# Patient Record
Sex: Male | Born: 1970 | Race: White | Hispanic: No | Marital: Married | Smoking: Never smoker
Health system: Southern US, Community
[De-identification: ages and names within clinical notes are randomized; demographics above are authoritative.]

---

## 2018-09-30 ENCOUNTER — Telehealth: Payer: Self-pay | Admitting: Hematology

## 2018-09-30 NOTE — Telephone Encounter (Signed)
A new patient appt has been scheduled for the pt to see Dr. Irene Limbo on 4/2 at 11am. Pt has been cld and made aware to arrive 15 minutes early.

## 2018-10-01 ENCOUNTER — Telehealth: Payer: Self-pay | Admitting: *Deleted

## 2018-10-01 NOTE — Telephone Encounter (Signed)
Contacted patient per [facility] current rescheduling guidelines.  Dr.Kale would like to reschedule new patient appt and reschedule in 6-8 weeks. Patient advised if symptomatic in any way before appt to contact Dr. Grier Mitts office.  Informed patient that scheduling will contact them. Patient verbalized understanding.  Schedule msg sent.

## 2018-10-02 ENCOUNTER — Ambulatory Visit: Payer: Self-pay | Admitting: Hematology

## 2018-10-03 ENCOUNTER — Telehealth: Payer: Self-pay | Admitting: Hematology

## 2018-10-03 NOTE — Telephone Encounter (Signed)
New patient appt has been rescheduled to see Dr. Irene Limbo on 5/28 at 11am. Pt has been cld and agreed tot he appt date and time.

## 2018-10-21 ENCOUNTER — Telehealth: Payer: Self-pay | Admitting: Hematology

## 2018-10-21 ENCOUNTER — Telehealth: Payer: Self-pay | Admitting: *Deleted

## 2018-10-21 NOTE — Telephone Encounter (Signed)
Patient called to report swelling and tenderness on right side of neck. States he thinks he should come in sooner than appt 5/28. Per Dr. Irene Limbo, make appt. New patient coordinator contacted, she will contact patient.

## 2018-10-21 NOTE — Telephone Encounter (Signed)
Attempted to return the pt's phone call but his vm isn't setup.

## 2018-10-22 ENCOUNTER — Other Ambulatory Visit: Payer: Self-pay

## 2018-10-22 ENCOUNTER — Inpatient Hospital Stay: Payer: Managed Care, Other (non HMO)

## 2018-10-22 ENCOUNTER — Telehealth: Payer: Self-pay | Admitting: Hematology

## 2018-10-22 ENCOUNTER — Inpatient Hospital Stay: Payer: Managed Care, Other (non HMO) | Attending: Hematology | Admitting: Hematology

## 2018-10-22 VITALS — BP 104/69 | HR 60 | Temp 97.5°F | Resp 17 | Ht 77.11 in | Wt 237.9 lb

## 2018-10-22 DIAGNOSIS — Z8572 Personal history of non-Hodgkin lymphomas: Secondary | ICD-10-CM | POA: Diagnosis not present

## 2018-10-22 DIAGNOSIS — C8331 Diffuse large B-cell lymphoma, lymph nodes of head, face, and neck: Secondary | ICD-10-CM

## 2018-10-22 LAB — CBC WITH DIFFERENTIAL/PLATELET
Abs Immature Granulocytes: 0.01 10*3/uL (ref 0.00–0.07)
Basophils Absolute: 0 10*3/uL (ref 0.0–0.1)
Basophils Relative: 1 %
Eosinophils Absolute: 0.1 10*3/uL (ref 0.0–0.5)
Eosinophils Relative: 2 %
HCT: 48.4 % (ref 39.0–52.0)
Hemoglobin: 15.8 g/dL (ref 13.0–17.0)
Immature Granulocytes: 0 %
Lymphocytes Relative: 21 %
Lymphs Abs: 1 10*3/uL (ref 0.7–4.0)
MCH: 28.7 pg (ref 26.0–34.0)
MCHC: 32.6 g/dL (ref 30.0–36.0)
MCV: 88 fL (ref 80.0–100.0)
Monocytes Absolute: 0.4 10*3/uL (ref 0.1–1.0)
Monocytes Relative: 9 %
Neutro Abs: 3 10*3/uL (ref 1.7–7.7)
Neutrophils Relative %: 67 %
Platelets: 184 10*3/uL (ref 150–400)
RBC: 5.5 MIL/uL (ref 4.22–5.81)
RDW: 12.7 % (ref 11.5–15.5)
WBC: 4.5 10*3/uL (ref 4.0–10.5)
nRBC: 0 % (ref 0.0–0.2)

## 2018-10-22 LAB — CMP (CANCER CENTER ONLY)
ALT: 15 U/L (ref 0–44)
AST: 19 U/L (ref 15–41)
Albumin: 4.2 g/dL (ref 3.5–5.0)
Alkaline Phosphatase: 56 U/L (ref 38–126)
Anion gap: 10 (ref 5–15)
BUN: 14 mg/dL (ref 6–20)
CO2: 27 mmol/L (ref 22–32)
Calcium: 9.4 mg/dL (ref 8.9–10.3)
Chloride: 106 mmol/L (ref 98–111)
Creatinine: 1.07 mg/dL (ref 0.61–1.24)
GFR, Est AFR Am: >60 mL/min (ref >60)
GFR, Estimated: >60 mL/min (ref >60)
Glucose, Bld: 87 mg/dL (ref 70–99)
Potassium: 4.2 mmol/L (ref 3.5–5.1)
Sodium: 143 mmol/L (ref 135–145)
Total Bilirubin: 0.6 mg/dL (ref 0.3–1.2)
Total Protein: 7.4 g/dL (ref 6.5–8.1)

## 2018-10-22 LAB — LACTATE DEHYDROGENASE: LDH: 157 U/L (ref 98–192)

## 2018-10-22 LAB — TSH: TSH: 3.361 u[IU]/mL (ref 0.320–4.118)

## 2018-10-22 LAB — T4, FREE: Free T4: 0.76 ng/dL — ABNORMAL LOW (ref 0.82–1.77)

## 2018-10-22 NOTE — Progress Notes (Signed)
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 He was staged as DLBCL, NOS, stage IA, with no BCL2, no BCL6, and no MYC6 rearrangement events detected on FISH. He completed 3 planned cycles of R-CHOP between 02/03/18 and 03/20/18 with Dr. Lavona Mound in [place], [place]. The pt then received consolidative ISRT of 48 Gy over 24 fractions to the right parotid and right neck between 04/28/18 and 05/23/18 with Dr. Zara Council in [place], [place]. His last imaging from 07/25/18 CT Neck did not reveal any remaining signs of lymphoma or suspicious adenopathy.  HISTORY OF PRESENTING ILLNESS:   Terry Dillon is a wonderful 48 y.o. male who has been referred to [place] by Dr. Lavona Mound  for evaluation and management of his Diffuse Large B-Cell Lymphoma. The pt reports that he is doing well overall.   The pt reports that he first noticed a small right neck swelling in May 2019. He notes that he did not feel that this changed much prior to this biopsy in July 2019. He did not have any fevers, chills, night sweats nor unexpected weight loss. The pt notes that he was found to have an infection in his parotid gland, which responded to a week of antibiotics, just prior to his initial biopsy.  The pt notes that he began feeling some soreness in the right side of his neck two weeks ago, which he attributed to working out. He notes that his wife felt that this spot is swollen two days ago, though the pt denies feeling nodularity or swelling. He is not sure if this is scar tissue related to his RT or recurrence.  He denies local trauma, sore throat, cough, runny nose or concern for  infections. He denies fevers, chills, night sweats or unexpected weight loss.   The pt notes that he is sleeping alright and endorses good energy levels. He notes that he is pretty thirsty, and his dry mouth is improving slowly since completing RT. He endorses some pain in his right jaw since completing RT when he yawns or opens his mouth widely. He notes that he does not have other dental pain.  The pt notes that he still has his port, and that this hasn't been flushed or accessed since he completed chemotherapy in October.  Most recent lab results not made available at time of initial visit.  On review of systems, pt reports right neck soreness, good energy levels, staying active, right jaw pain, and denies fevers, chills, night sweats, unexpected weigh loss, dental pain, cough, runny nose, throat soreness, back pains, abdominal pains, inguinal pain, leg swelling, and any other symptoms.  On PMHx the pt reports DLBCL diagnosed in July 2019 and denies other medical concerns. 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 The pt denies consuming ETOH whatsoever.  MEDICAL HISTORY:  No past medical history on file.  SURGICAL HISTORY: Cervical LN biopsy  SOCIAL HISTORY: Social History   Socioeconomic History  . Marital status: Married    Spouse name: Not on file  . Number of children: Not on file  . Years of education: Not on file  . Highest education level: Not on  file  Occupational History  . Not on file  Social Needs  . Financial resource strain: Not on file  . Food insecurity:    Worry: Not on file    Inability: Not on file  . Transportation needs:    Medical: Not on file    Non-medical: Not on file  Tobacco Use  . Smoking status: Not on file  Substance and Sexual Activity  . Alcohol use: Not on file  . Drug use: Not on file  . Sexual activity: Not on file  Lifestyle  . Physical activity:    Days per week: Not on file    Minutes per  session: Not on file  . Stress: Not on file  Relationships  . Social connections:    Talks on phone: Not on file    Gets together: Not on file    Attends religious service: Not on file    Active member of club or organization: Not on file    Attends meetings of clubs or organizations: Not on file    Relationship status: Not on file  . Intimate partner violence:    Fear of current or ex partner: Not on file    Emotionally abused: Not on file    Physically abused: Not on file    Forced sexual activity: Not on file  Other Topics Concern  . Not on file  Social History Narrative  . Not on file    FAMILY HISTORY: No family history on file.  ALLERGIES:  has no allergies on file.  MEDICATIONS:  No current outpatient medications on file.   No current facility-administered medications for this visit.     REVIEW OF SYSTEMS:    10 Point review of Systems was done is negative except as noted above.  PHYSICAL EXAMINATION: ECOG PERFORMANCE STATUS: 0 - Asymptomatic  . Vitals:   10/22/18 1038  BP: 104/69  Pulse: 60  Resp: 17  Temp: (!) 97.5 F (36.4 C)  SpO2: 99%   Filed Weights   10/22/18 1038  Weight: 237 lb 14.4 oz (107.9 kg)   .Body mass index is 28.13 kg/m.  GENERAL:alert, in no acute distress and comfortable SKIN: no acute rashes, no significant lesions EYES: conjunctiva are pink and non-injected, sclera anicteric OROPHARYNX: MMM, no exudates, no oropharyngeal erythema or ulceration NECK: supple, no JVD LYMPH:  no palpable lymphadenopathy in the cervical, axillary or inguinal regions LUNGS: clear to auscultation b/l with normal respiratory effort HEART: regular rate & rhythm ABDOMEN:  normoactive bowel sounds , non tender, not distended. Extremity: no pedal edema PSYCH: alert & oriented x 3 with fluent speech NEURO: no focal motor/sensory deficits  LABORATORY DATA:  I have reviewed the data as listed  . CBC Latest Ref Rng & Units 10/22/2018  WBC 4.0 -  10.5 K/uL 4.5  Hemoglobin 13.0 - 17.0 g/dL 15.8  Hematocrit 39.0 - 52.0 % 48.4  Platelets 150 - 400 K/uL 184    . CMP Latest Ref Rng & Units 10/22/2018  Glucose 70 - 99 mg/dL 87  BUN 6 - 20 mg/dL 14  Creatinine 0.61 - 1.24 mg/dL 1.07  Sodium 135 - 145 mmol/L 143  Potassium 3.5 - 5.1 mmol/L 4.2  Chloride 98 - 111 mmol/L 106  CO2 22 - 32 mmol/L 27  Calcium 8.9 - 10.3 mg/dL 9.4  Total Protein 6.5 - 8.1 g/dL 7.4  Total Bilirubin 0.3 - 1.2 mg/dL 0.6  Alkaline Phos 38 - 126 U/L 56  AST 15 - 41  U/L 19  ALT 0 - 44 U/L 15   . Lab Results  Component Value Date   LDH 157 10/22/2018     RADIOGRAPHIC STUDIES: I have personally reviewed the radiological images as listed and agreed with the findings in the report.  07/25/18 CT Neck, s/p ISRT and R-CHOP x3 cycles:   04/18/18 Restaging PET/CT s/p R-CHOP x3 cycles (prior to ISRT):  01/17/18 Initial Staging PET/CT:    No results found.   ASSESSMENT & PLAN:  48 y.o. male with  1. History of Diffuse Large B-Cell Lymphoma, NOS, Stage IA 01/03/18 Excisional biopsy of Right Level II Cervical LN revealed DLBCL, germinal center type FISH negative for BCL2, BCL6, and MYC6 rearrangements S/p 3 cycles of R-CHOP between 02/03/18 and 03/20/18 S/p Consolidative ISRT of 48 Gy over 24 fractions to right parotied and right neck between 04/28/18 and 05/23/18 07/25/18 CT Neck did not reveal any remaining signs of lymphoma nor suspicious adenopathy  PLAN: -Discussed that I did not palpate a discrete enlarged lymph node in the right cervical region specifically, nor anywhere else in general. -Feel that right cervical soreness is related to scar tissues vs muscular strain -Discussed that if the pt develops fevers, drainage, worsening swelling or increasing pain in the jaw, this would be reason to obtain a CT to rule out radiation related bone changes. -The pt shows no clinical or lab progression/return of his DLBCL at this time. Cbc, cmpand LDH WNL today  -No indication for further treatment at this time. -Will refer the pt to IR for port removal -Will order labs today -Will see the pt back in 3 months  2. Low FT4 normal TSH -monitor - will rpt on followup.  -Labs today -Interventional radiology for port removal in 1-2 weeks -RTC with Dr Irene Limbo with labs in 3 months   All of the patients questions were answered with apparent satisfaction. The patient knows to call the clinic with any problems, questions or concerns.  The total time spent in the appt was 45 minutes and more than 50% was on counseling and direct patient cares.    Sullivan Lone MD MS AAHIVMS [facility] Hematology/Oncology Physician [facility]  (Office):       [phone] (Work cell):  [phone] (Fax):           [phone]  10/22/2018 11:29 AM  I, Baldwin Jamaica, am acting as a scribe for Dr. Sullivan Lone.   .I have reviewed the above documentation for accuracy and completeness, and I agree with the above. Brunetta Genera MD

## 2018-10-22 NOTE — Telephone Encounter (Signed)
Scheduled appt per 4/22 los. °

## 2018-11-27 ENCOUNTER — Ambulatory Visit: Payer: Self-pay | Admitting: Hematology

## 2018-12-17 ENCOUNTER — Other Ambulatory Visit: Payer: Self-pay | Admitting: Radiology

## 2018-12-18 ENCOUNTER — Other Ambulatory Visit: Payer: Self-pay | Admitting: Radiology

## 2018-12-19 ENCOUNTER — Ambulatory Visit (HOSPITAL_COMMUNITY)
Admission: RE | Admit: 2018-12-19 | Discharge: 2018-12-19 | Disposition: A | Discharge status: Home / Self Care | Payer: Managed Care, Other (non HMO) | Source: Ambulatory Visit | Attending: Hematology | Admitting: Hematology

## 2018-12-19 ENCOUNTER — Encounter (HOSPITAL_COMMUNITY): Payer: Self-pay

## 2018-12-19 ENCOUNTER — Other Ambulatory Visit: Payer: Self-pay

## 2018-12-19 DIAGNOSIS — Z452 Encounter for adjustment and management of vascular access device: Secondary | ICD-10-CM | POA: Diagnosis present

## 2018-12-19 DIAGNOSIS — Z923 Personal history of irradiation: Secondary | ICD-10-CM | POA: Insufficient documentation

## 2018-12-19 DIAGNOSIS — Z9221 Personal history of antineoplastic chemotherapy: Secondary | ICD-10-CM | POA: Diagnosis not present

## 2018-12-19 DIAGNOSIS — Z8572 Personal history of non-Hodgkin lymphomas: Secondary | ICD-10-CM | POA: Insufficient documentation

## 2018-12-19 DIAGNOSIS — C8331 Diffuse large B-cell lymphoma, lymph nodes of head, face, and neck: Secondary | ICD-10-CM

## 2018-12-19 HISTORY — PX: IR REMOVAL TUN ACCESS W/ PORT W/O FL MOD SED: IMG2290

## 2018-12-19 LAB — CBC WITH DIFFERENTIAL/PLATELET
Abs Immature Granulocytes: 0.01 10*3/uL (ref 0.00–0.07)
Basophils Absolute: 0 10*3/uL (ref 0.0–0.1)
Basophils Relative: 0 %
Eosinophils Absolute: 0.1 10*3/uL (ref 0.0–0.5)
Eosinophils Relative: 2 %
HCT: 43.3 % (ref 39.0–52.0)
Hemoglobin: 14.6 g/dL (ref 13.0–17.0)
Immature Granulocytes: 0 %
Lymphocytes Relative: 18 %
Lymphs Abs: 0.8 10*3/uL (ref 0.7–4.0)
MCH: 29.9 pg (ref 26.0–34.0)
MCHC: 33.7 g/dL (ref 30.0–36.0)
MCV: 88.5 fL (ref 80.0–100.0)
Monocytes Absolute: 0.5 10*3/uL (ref 0.1–1.0)
Monocytes Relative: 11 %
Neutro Abs: 3.1 10*3/uL (ref 1.7–7.7)
Neutrophils Relative %: 69 %
Platelets: 178 10*3/uL (ref 150–400)
RBC: 4.89 MIL/uL (ref 4.22–5.81)
RDW: 12.1 % (ref 11.5–15.5)
WBC: 4.4 10*3/uL (ref 4.0–10.5)
nRBC: 0 % (ref 0.0–0.2)

## 2018-12-19 LAB — PROTIME-INR
INR: 0.9 (ref 0.8–1.2)
Prothrombin Time: 12.1 seconds (ref 11.4–15.2)

## 2018-12-19 MED ORDER — FENTANYL CITRATE (PF) 100 MCG/2ML IJ SOLN
INTRAMUSCULAR | Status: AC | PRN
  Administered 2018-12-19 (×2): 50 ug via INTRAVENOUS

## 2018-12-19 MED ORDER — SODIUM CHLORIDE 0.9 % IV SOLN
INTRAVENOUS | Status: DC
  Administered 2018-12-19: 11:00:00 via INTRAVENOUS

## 2018-12-19 MED ORDER — FENTANYL CITRATE (PF) 100 MCG/2ML IJ SOLN
INTRAMUSCULAR | Status: AC
  Filled 2018-12-19: qty 2

## 2018-12-19 MED ORDER — MIDAZOLAM HCL 2 MG/2ML IJ SOLN
INTRAMUSCULAR | Status: AC | PRN
  Administered 2018-12-19 (×4): 1 mg via INTRAVENOUS

## 2018-12-19 MED ORDER — LIDOCAINE HCL (PF) 1 % IJ SOLN
INTRAMUSCULAR | Status: AC | PRN
  Administered 2018-12-19: 10 mL

## 2018-12-19 MED ORDER — CEFAZOLIN SODIUM-DEXTROSE 2-4 GM/100ML-% IV SOLN
INTRAVENOUS | Status: AC
  Administered 2018-12-19: 2 g via INTRAVENOUS
  Filled 2018-12-19: qty 100

## 2018-12-19 MED ORDER — LIDOCAINE HCL 1 % IJ SOLN
INTRAMUSCULAR | Status: AC
  Filled 2018-12-19: qty 20

## 2018-12-19 MED ORDER — CEFAZOLIN SODIUM-DEXTROSE 2-4 GM/100ML-% IV SOLN
2.0000 g | INTRAVENOUS | Status: AC
  Administered 2018-12-19: 2 g via INTRAVENOUS

## 2018-12-19 MED ORDER — MIDAZOLAM HCL 2 MG/2ML IJ SOLN
INTRAMUSCULAR | Status: AC
  Filled 2018-12-19: qty 4

## 2018-12-19 NOTE — Consult Note (Signed)
Chief Complaint: Patient was seen in consultation today for port a cath removal  Referring Physician(s): Brunetta Genera  Supervising Physician: Aletta Edouard  Patient Status: [facility] - Out-pt  History of Present Illness: Terry Dillon is a 48 y.o. male with history of diffuse large B-cell lymphoma, status post chemoradiation and currently in remission.  He had left chest wall Port-A-Cath placed in [place] in 2019.  He presents today for Port-A-Cath removal.   PMH: Denies hypertension, coronary artery disease, lung disease, diabetes PSH: Cervical lymph node biopsy  Allergies: Patient has no allergy information on record.  Medications: Prior to Admission medications   Not on File     No family history on file.  Social History   Socioeconomic History  . Marital status: Married    Spouse name: Not on file  . Number of children: Not on file  . Years of education: Not on file  . Highest education level: Not on file  Occupational History  . Not on file  Social Needs  . Financial resource strain: Not on file  . Food insecurity    Worry: Not on file    Inability: Not on file  . Transportation needs    Medical: Not on file    Non-medical: Not on file  Tobacco Use  . Smoking status: Not on file  Substance and Sexual Activity  . Alcohol use: Not on file  . Drug use: Not on file  . Sexual activity: Not on file  Lifestyle  . Physical activity    Days per week: Not on file    Minutes per session: Not on file  . Stress: Not on file  Relationships  . Social Herbalist on phone: Not on file    Gets together: Not on file    Attends religious service: Not on file    Active member of club or organization: Not on file    Attends meetings of clubs or organizations: Not on file    Relationship status: Not on file  Other Topics Concern  . Not on file  Social History Narrative  . Not on file      Review of Systems denies fever, headache, chest pain,  dyspnea, cough, abdominal/back pain, nausea, vomiting or bleeding.  Vital Signs:pend   Physical Exam awake, alert.  Chest clear to auscultation bilaterally.  Clean, intact left chest wall Port-A-Cath.  Heart with regular rate and rhythm. Abd soft, positive bowel sounds, nontender.  No lower extremity edema.  Imaging: No results found.  Labs:  CBC: Recent Labs    10/22/18 1129  WBC 4.5  HGB 15.8  HCT 48.4  PLT 184    COAGS: No results for input(s): INR, APTT in the last 8760 hours.  BMP: Recent Labs    10/22/18 1129  NA 143  K 4.2  CL 106  CO2 27  GLUCOSE 87  BUN 14  CALCIUM 9.4  CREATININE 1.07  GFRNONAA >60  GFRAA >60    LIVER FUNCTION TESTS: Recent Labs    10/22/18 1129  BILITOT 0.6  AST 19  ALT 15  ALKPHOS 56  PROT 7.4  ALBUMIN 4.2    TUMOR MARKERS: No results for input(s): AFPTM, CEA, CA199, CHROMGRNA in the last 8760 hours.  Assessment and Plan: Patient with history of diffuse large B-cell lymphoma 2019, status post chemoradiation and currently in remission.  He had left chest wall Port-A-Cath placed in [place] in 2019.  He presents today for Port-A-Cath removal.  Details/risks of procedure, including but not limited to, internal bleeding, infection, injury to adjacent structures discussed with patient with his understanding and consent.  LABS PENDING   Thank you for this interesting consult.  I greatly enjoyed meeting Terry Dillon and look forward to participating in their care.  A copy of this report was sent to the requesting provider on this date.  Electronically Signed: D. Rowe Robert, PA-C 12/19/2018, 10:18 AM   I spent a total of 20 minutes  in face to face in clinical consultation, greater than 50% of which was counseling/coordinating care for Port-A-Cath removal

## 2018-12-19 NOTE — Procedures (Signed)
Interventional Radiology Procedure Note  Procedure: Port removal  Complications: None  Estimated Blood Loss: < 10 mL  Findings: Left chest port removed in entirety. Wound closed.  Mosella Kasa T. Charlese Gruetzmacher, M.D Pager:  [phone]   

## 2018-12-19 NOTE — Discharge Instructions (Signed)
Moderate Conscious Sedation, Adult, Care After These instructions provide you with information about caring for yourself after your procedure. Your health care provider may also give you more specific instructions. Your treatment has been planned according to current medical practices, but problems sometimes occur. Call your health care provider if you have any problems or questions after your procedure. What can I expect after the procedure? After your procedure, it is common:  To feel sleepy for several hours.  To feel clumsy and have poor balance for several hours.  To have poor judgment for several hours.  To vomit if you eat too soon. Follow these instructions at home: For at least 24 hours after the procedure:   Do not: ? Participate in activities where you could fall or become injured. ? Drive. ? Use heavy machinery. ? Drink alcohol. ? Take sleeping pills or medicines that cause drowsiness. ? Make important decisions or sign legal documents. ? Take care of children on your own.  Rest. Eating and drinking  Follow the diet recommended by your health care provider.  If you vomit: ? Drink water, juice, or soup when you can drink without vomiting. ? Make sure you have little or no nausea before eating solid foods. General instructions  Have a responsible adult stay with you until you are awake and alert.  Take over-the-counter and prescription medicines only as told by your health care provider.  If you smoke, do not smoke without supervision.  Keep all follow-up visits as told by your health care provider. This is important. Contact a health care provider if:  You keep feeling nauseous or you keep vomiting.  You feel light-headed.  You develop a rash.  You have a fever. Get help right away if:  You have trouble breathing. This information is not intended to replace advice given to you by your health care provider. Make sure you discuss any questions you have  with your health care provider. Document Released: 04/08/2013 Document Revised: 11/21/2015 Document Reviewed: 10/08/2015 Elsevier Interactive Patient Education  2019 [address] Removal, Care After This sheet gives you information about how to care for yourself after your procedure. Your health care provider may also give you more specific instructions. If you have problems or questions, contact your health care provider. What can I expect after the procedure? After the procedure, it is common to have:  Soreness or pain near your incision.  Some swelling or bruising near your incision. Follow these instructions at home: Medicines  Take over-the-counter and prescription medicines only as told by your health care provider.  If you were prescribed an antibiotic medicine, take it as told by your health care provider. Do not stop taking the antibiotic even if you start to feel better. Bathing  Do not take baths, swim, or use a hot tub until your health care provider approves. Ask your health care provider if you can take showers. You may only be allowed to take sponge baths.  You may shower tomorrow. Incision care   Follow instructions from your health care provider about how to take care of your incision. Make sure you: ? Wash your hands with soap and water before you change your bandage (dressing). If soap and water are not available, use hand sanitizer. ? Change your dressing as told by your health care provider.  You may remove your dressing tomorrow. ? Keep your dressing dry. ? Leave stitches (sutures), skin glue, or adhesive strips in place. These skin closures  may need to stay in place for 2 weeks or longer. If adhesive strip edges start to loosen and curl up, you may trim the loose edges. Do not remove adhesive strips completely unless your health care provider tells you to do that.  Check your incision area every day for signs of infection. Check for: ? More  redness, swelling, or pain. ? More fluid or blood. ? Warmth. ? Pus or a bad smell. Driving   Do not drive for 24 hours if you were given a medicine to help you relax (sedative) during your procedure.  If you did not receive a sedative, ask your health care provider when it is safe to drive. Activity  Return to your normal activities as told by your health care provider. Ask your health care provider what activities are safe for you.  Do not lift anything that is heavier than 10 lb (4.5 kg), or the limit that you are told, until your health care provider says that it is safe.  Do not do activities that involve lifting your arms over your head. General instructions  Do not use any products that contain nicotine or tobacco, such as cigarettes and e-cigarettes. These can delay healing. If you need help quitting, ask your health care provider.  Keep all follow-up visits as told by your health care provider. This is important. Contact a health care provider if:  You have more redness, swelling, or pain around your incision.  You have more fluid or blood coming from your incision.  Your incision feels warm to the touch.  You have pus or a bad smell coming from your incision.  You have pain that is not relieved by your pain medicine. Get help right away if you have:  A fever or chills.  Chest pain.  Difficulty breathing. Summary  After the procedure, it is common to have pain, soreness, swelling, or bruising near your incision.  If you were prescribed an antibiotic medicine, take it as told by your health care provider. Do not stop taking the antibiotic even if you start to feel better.  Do not drive for 24 hours if you were given a sedative during your procedure.  Return to your normal activities as told by your health care provider. Ask your health care provider what activities are safe for you. This information is not intended to replace advice given to you by your health  care provider. Make sure you discuss any questions you have with your health care provider. Document Released: 05/30/2015 Document Revised: 08/01/2017 Document Reviewed: 08/01/2017 Elsevier Interactive Patient Education  2019 [employer].

## 2019-01-19 ENCOUNTER — Telehealth: Payer: Self-pay | Admitting: *Deleted

## 2019-01-19 NOTE — Telephone Encounter (Signed)
Patient has appt on 7/22 for 3 month f/u. Patient states he forgot he had the appt until he received the screening call. He would like to reschedule appts for lab/MD as he is out of town this week. Appts on 7/22 cancelled and message sent to scheduling.

## 2019-01-21 ENCOUNTER — Inpatient Hospital Stay: Payer: Managed Care, Other (non HMO)

## 2019-01-21 ENCOUNTER — Inpatient Hospital Stay: Payer: Managed Care, Other (non HMO) | Admitting: Hematology

## 2019-02-09 NOTE — Progress Notes (Signed)
HEMATOLOGY/ONCOLOGY CONSULTATION NOTE  Date of Service: 02/09/2019  Patient Care Team: System, Pcp Not In as PCP - General  CHIEF COMPLAINTS/PURPOSE OF CONSULTATION:  Diffuse Large B-Cell Lymphoma  Oncologic History:  Terry Dillon was diagnosed with Diffuse Large B-Cell Lymphoma, germinal center type, with an excisional biopsy of a right level II deep cervical lymph node on 01/03/18. He was staged as DLBCL, NOS, stage IA, with no BCL2, no BCL6, and no MYC6 rearrangement events detected on FISH. He completed 3 planned cycles of R-CHOP between 02/03/18 and 03/20/18 with Dr. Lavona Mound in [place], [place]. The pt then received consolidative ISRT of 48 Gy over 24 fractions to the right parotid and right neck between 04/28/18 and 05/23/18 with Dr. Zara Council in [place], [place]. His last imaging from 07/25/18 CT Neck did not reveal any remaining signs of lymphoma or suspicious adenopathy.  HISTORY OF PRESENTING ILLNESS:   Terry Dillon is a wonderful 48 y.o. male who has been referred to [place] by Dr. Lavona Mound  for evaluation and management of his Diffuse Large B-Cell Lymphoma. The pt reports that he is doing well overall.   The pt reports that he first noticed a small right neck swelling in May 2019. He notes that he did not feel that this changed much prior to this biopsy in July 2019. He did not have any fevers, chills, night sweats nor unexpected weight loss. The pt notes that he was found to have an infection in his parotid gland, which responded to a week of antibiotics, just prior to his initial biopsy.  The pt notes that he began feeling some soreness in the right side of his neck two weeks ago, which he attributed to working out. He notes that his wife felt that this spot is swollen two days ago, though the pt denies feeling nodularity or swelling. He is not sure if this is scar tissue related to his RT or recurrence.  He denies local trauma, sore throat, cough, runny nose or concern for  infections. He denies fevers, chills, night sweats or unexpected weight loss.   The pt notes that he is sleeping alright and endorses good energy levels. He notes that he is pretty thirsty, and his dry mouth is improving slowly since completing RT. He endorses some pain in his right jaw since completing RT when he yawns or opens his mouth widely. He notes that he does not have other dental pain.  The pt notes that he still has his port, and that this hasn't been flushed or accessed since he completed chemotherapy in October.  Most recent lab results not made available at time of initial visit.  On review of systems, pt reports right neck soreness, good energy levels, staying active, right jaw pain, and denies fevers, chills, night sweats, unexpected weigh loss, dental pain, cough, runny nose, throat soreness, back pains, abdominal pains, inguinal pain, leg swelling, and any other symptoms.  On PMHx the pt reports DLBCL diagnosed in July 2019 and denies other medical concerns. 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 The pt denies consuming ETOH whatsoever.  Interval History  Terry Dillon is a 48 y.o. male presenting today for the management and evaluation of DLBCL. The patient's last visit with [place] was on 10/22/2018. The pt reports that she is doing well overall.  The pt reports no new concerns. He reports a dizziness and "swooshy sound" in his head, which began during chemotherapy. The episodes  come on randomly and cause an aversion to light. It happened much less often after stopping chemotherapy, but it has happened twice in the past week. Denies hx of hearing problems, ear injuries, migraine headaches.  His port was removed on 12/19/2018. He has some neck soreness, but it is not bad. Denies belly pain, unexpected weight changes, SOB, fevers, chills, night sweats. He is moving his bowels well.  Lab results today (02/10/2019) of CBC w/diff is as follows: all  values are WNL except for Creatinine at 1.40, GFR at 59 02/10/2019 free T4 is pending 02/10/2019 TSH is 3.218, FT4 0.81 02/10/2019 LDH is 215  On review of systems, pt reports occasional dizziness, moving his bowels well, and denies belly pain, unexpected weight changes, SOB, fevers, chills, belly pain, night sweats and any other symptoms.   MEDICAL HISTORY:  No past medical history on file.  SURGICAL HISTORY: Cervical LN biopsy  SOCIAL HISTORY: Social History   Socioeconomic History  . Marital status: Married    Spouse name: Not on file  . Number of children: Not on file  . Years of education: Not on file  . Highest education level: Not on file  Occupational History  . Not on file  Social Needs  . Financial resource strain: Not on file  . Food insecurity    Worry: Not on file    Inability: Not on file  . Transportation needs    Medical: Not on file    Non-medical: Not on file  Tobacco Use  . Smoking status: Never Smoker  . Smokeless tobacco: Never Used  Substance and Sexual Activity  . Alcohol use: Not on file  . Drug use: Not on file  . Sexual activity: Not on file  Lifestyle  . Physical activity    Days per week: Not on file    Minutes per session: Not on file  . Stress: Not on file  Relationships  . Social Herbalist on phone: Not on file    Gets together: Not on file    Attends religious service: Not on file    Active member of club or organization: Not on file    Attends meetings of clubs or organizations: Not on file    Relationship status: Not on file  . Intimate partner violence    Fear of current or ex partner: Not on file    Emotionally abused: Not on file    Physically abused: Not on file    Forced sexual activity: Not on file  Other Topics Concern  . Not on file  Social History Narrative  . Not on file    FAMILY HISTORY: No family history on file.  ALLERGIES:  has no allergies on file.  MEDICATIONS:  No current outpatient  medications on file.   No current facility-administered medications for this visit.     REVIEW OF SYSTEMS:    A 10+ POINT REVIEW OF SYSTEMS WAS OBTAINED including neurology, dermatology, psychiatry, cardiac, respiratory, lymph, extremities, GI, GU, Musculoskeletal, constitutional, breasts, reproductive, HEENT.  All pertinent positives are noted in the HPI.  All others are negative. Marland Kitchen  PHYSICAL EXAMINATION: ECOG PERFORMANCE STATUS: 0 - Asymptomatic  . Vitals:   02/10/19 1455  BP: 114/64  Pulse: 68  Resp: 18  Temp: 98.3 F (36.8 C)  SpO2: 99%   Filed Weights   02/10/19 1455  Weight: 239 lb 3.2 oz (108.5 kg)   .Body mass index is 28.28 kg/m.   GENERAL:alert, in no  acute distress and comfortable SKIN: no acute rashes, no significant lesions EYES: conjunctiva are pink and non-injected, sclera anicteric OROPHARYNX: MMM, no exudates, no oropharyngeal erythema or ulceration NECK: supple, no JVD LYMPH:  no palpable lymphadenopathy in the cervical, axillary or inguinal regions LUNGS: clear to auscultation b/l with normal respiratory effort HEART: regular rate & rhythm ABDOMEN:  normoactive bowel sounds , non tender, not distended. No palpable hepatosplenomegaly.  Extremity: no pedal edema PSYCH: alert & oriented x 3 with fluent speech NEURO: no focal motor/sensory deficits   LABORATORY DATA:  I have reviewed the data as listed  . CBC Latest Ref Rng & Units 02/10/2019 12/19/2018 10/22/2018  WBC 4.0 - 10.5 K/uL 5.6 4.4 4.5  Hemoglobin 13.0 - 17.0 g/dL 15.6 14.6 15.8  Hematocrit 39.0 - 52.0 % 46.0 43.3 48.4  Platelets 150 - 400 K/uL 205 178 184    . CMP Latest Ref Rng & Units 02/10/2019 10/22/2018  Glucose 70 - 99 mg/dL 90 87  BUN 6 - 20 mg/dL 15 14  Creatinine 0.61 - 1.24 mg/dL 1.40(H) 1.07  Sodium 135 - 145 mmol/L 140 143  Potassium 3.5 - 5.1 mmol/L 4.4 4.2  Chloride 98 - 111 mmol/L 103 106  CO2 22 - 32 mmol/L 23 27  Calcium 8.9 - 10.3 mg/dL 9.8 9.4  Total Protein 6.5 -  8.1 g/dL 7.3 7.4  Total Bilirubin 0.3 - 1.2 mg/dL 1.1 0.6  Alkaline Phos 38 - 126 U/L 49 56  AST 15 - 41 U/L 25 19  ALT 0 - 44 U/L 20 15   . Lab Results  Component Value Date   LDH 215 (H) 02/10/2019     RADIOGRAPHIC STUDIES: I have personally reviewed the radiological images as listed and agreed with the findings in the report.  07/25/18 CT Neck, s/p ISRT and R-CHOP x3 cycles:   04/18/18 Restaging PET/CT s/p R-CHOP x3 cycles (prior to ISRT):  01/17/18 Initial Staging PET/CT:    No results found.   ASSESSMENT & PLAN:   48 y.o. male with  1. History of Diffuse Large B-Cell Lymphoma, NOS, Stage IA 01/03/18 Excisional biopsy of Right Level II Cervical LN revealed DLBCL, germinal center type FISH negative for BCL2, BCL6, and MYC6 rearrangements S/p 3 cycles of R-CHOP between 02/03/18 and 03/20/18 S/p Consolidative ISRT of 48 Gy over 24 fractions to right parotied and right neck between 04/28/18 and 05/23/18 07/25/18 CT Neck did not reveal any remaining signs of lymphoma nor suspicious adenopathy  PLAN: -Discussed pt labwork today, 02/10/2019; blood counts are normal, Creatinine at 1.40 -LDH borderline elevated at 215 -Previously discussed that if the pt develops fevers, drainage, worsening swelling or increasing pain in the jaw, this would be reason to obtain a CT to rule out radiation related bone changes. -The pt shows no clinical or lab progression/return of his DLBCL at this time. -No indication for further treatment at this time. -The pt will let us know if he wants a referral to an ENT for his episodes of dizziness -Recommended that the pt drink at least 48-64 oz of water each day -Will see the pt back in 4 months with labs   RTC with Dr Irene Limbo with labs in 4 months   All of the patients questions were answered with apparent satisfaction. The patient knows to call the clinic with any problems, questions or concerns.  The total time spent in the appt was 35 minutes and  more than 50% was on counseling and direct patient cares.  Sullivan Lone MD MS AAHIVMS [facility] Hematology/Oncology Physician [facility]  (Office):       [phone] (Work cell):  [phone] (Fax):           [phone]  02/09/2019 9:19 PM  I, De Burrs, am acting as a scribe for Dr. Irene Limbo  .I have reviewed the above documentation for accuracy and completeness, and I agree with the above. Brunetta Genera MD

## 2019-02-10 ENCOUNTER — Other Ambulatory Visit: Payer: Self-pay

## 2019-02-10 ENCOUNTER — Inpatient Hospital Stay (HOSPITAL_BASED_OUTPATIENT_CLINIC_OR_DEPARTMENT_OTHER): Payer: Managed Care, Other (non HMO) | Admitting: Hematology

## 2019-02-10 ENCOUNTER — Inpatient Hospital Stay: Payer: Managed Care, Other (non HMO) | Attending: Hematology

## 2019-02-10 VITALS — BP 114/64 | HR 68 | Temp 98.3°F | Resp 18 | Ht 77.11 in | Wt 239.2 lb

## 2019-02-10 DIAGNOSIS — Z8572 Personal history of non-Hodgkin lymphomas: Secondary | ICD-10-CM | POA: Diagnosis not present

## 2019-02-10 DIAGNOSIS — C8331 Diffuse large B-cell lymphoma, lymph nodes of head, face, and neck: Secondary | ICD-10-CM | POA: Diagnosis not present

## 2019-02-10 DIAGNOSIS — R42 Dizziness and giddiness: Secondary | ICD-10-CM | POA: Diagnosis not present

## 2019-02-10 DIAGNOSIS — Z79899 Other long term (current) drug therapy: Secondary | ICD-10-CM | POA: Insufficient documentation

## 2019-02-10 DIAGNOSIS — R740 Nonspecific elevation of levels of transaminase and lactic acid dehydrogenase [LDH]: Secondary | ICD-10-CM | POA: Insufficient documentation

## 2019-02-10 LAB — CBC WITH DIFFERENTIAL/PLATELET
Abs Immature Granulocytes: 0.01 10*3/uL (ref 0.00–0.07)
Basophils Absolute: 0 10*3/uL (ref 0.0–0.1)
Basophils Relative: 0 %
Eosinophils Absolute: 0.1 10*3/uL (ref 0.0–0.5)
Eosinophils Relative: 1 %
HCT: 46 % (ref 39.0–52.0)
Hemoglobin: 15.6 g/dL (ref 13.0–17.0)
Immature Granulocytes: 0 %
Lymphocytes Relative: 20 %
Lymphs Abs: 1.1 10*3/uL (ref 0.7–4.0)
MCH: 29.2 pg (ref 26.0–34.0)
MCHC: 33.9 g/dL (ref 30.0–36.0)
MCV: 86.1 fL (ref 80.0–100.0)
Monocytes Absolute: 0.4 10*3/uL (ref 0.1–1.0)
Monocytes Relative: 7 %
Neutro Abs: 4 10*3/uL (ref 1.7–7.7)
Neutrophils Relative %: 72 %
Platelets: 205 10*3/uL (ref 150–400)
RBC: 5.34 MIL/uL (ref 4.22–5.81)
RDW: 12.1 % (ref 11.5–15.5)
WBC: 5.6 10*3/uL (ref 4.0–10.5)
nRBC: 0 % (ref 0.0–0.2)

## 2019-02-10 LAB — LACTATE DEHYDROGENASE: LDH: 215 U/L — ABNORMAL HIGH (ref 98–192)

## 2019-02-10 LAB — T4, FREE: Free T4: 0.81 ng/dL (ref 0.61–1.12)

## 2019-02-10 LAB — CMP (CANCER CENTER ONLY)
ALT: 20 U/L (ref 0–44)
AST: 25 U/L (ref 15–41)
Albumin: 4.3 g/dL (ref 3.5–5.0)
Alkaline Phosphatase: 49 U/L (ref 38–126)
Anion gap: 14 (ref 5–15)
BUN: 15 mg/dL (ref 6–20)
CO2: 23 mmol/L (ref 22–32)
Calcium: 9.8 mg/dL (ref 8.9–10.3)
Chloride: 103 mmol/L (ref 98–111)
Creatinine: 1.4 mg/dL — ABNORMAL HIGH (ref 0.61–1.24)
GFR, Est AFR Am: >60 mL/min (ref >60)
GFR, Estimated: 59 mL/min — ABNORMAL LOW (ref >60)
Glucose, Bld: 90 mg/dL (ref 70–99)
Potassium: 4.4 mmol/L (ref 3.5–5.1)
Sodium: 140 mmol/L (ref 135–145)
Total Bilirubin: 1.1 mg/dL (ref 0.3–1.2)
Total Protein: 7.3 g/dL (ref 6.5–8.1)

## 2019-02-11 ENCOUNTER — Telehealth: Payer: Self-pay | Admitting: Hematology

## 2019-02-11 LAB — TSH: TSH: 3.218 u[IU]/mL (ref 0.320–4.118)

## 2019-02-11 NOTE — Telephone Encounter (Signed)
Scheduled appt per 8/11 los.  Will print and mail out calendar Aug 24.

## 2019-06-12 ENCOUNTER — Inpatient Hospital Stay: Payer: Managed Care, Other (non HMO) | Admitting: Hematology

## 2019-06-12 ENCOUNTER — Inpatient Hospital Stay: Payer: Managed Care, Other (non HMO) | Attending: Hematology

## 2019-06-12 ENCOUNTER — Other Ambulatory Visit: Payer: Self-pay

## 2019-06-12 VITALS — BP 116/84 | HR 65 | Temp 97.7°F | Resp 18 | Ht 77.11 in | Wt 244.4 lb

## 2019-06-12 DIAGNOSIS — D0511 Intraductal carcinoma in situ of right breast: Secondary | ICD-10-CM | POA: Diagnosis present

## 2019-06-12 DIAGNOSIS — C8331 Diffuse large B-cell lymphoma, lymph nodes of head, face, and neck: Secondary | ICD-10-CM

## 2019-06-12 LAB — CBC WITH DIFFERENTIAL/PLATELET
Abs Immature Granulocytes: 0.01 10*3/uL (ref 0.00–0.07)
Basophils Absolute: 0 10*3/uL (ref 0.0–0.1)
Basophils Relative: 1 %
Eosinophils Absolute: 0.1 10*3/uL (ref 0.0–0.5)
Eosinophils Relative: 2 %
HCT: 48.6 % (ref 39.0–52.0)
Hemoglobin: 16.5 g/dL (ref 13.0–17.0)
Immature Granulocytes: 0 %
Lymphocytes Relative: 25 %
Lymphs Abs: 1.2 10*3/uL (ref 0.7–4.0)
MCH: 29.6 pg (ref 26.0–34.0)
MCHC: 34 g/dL (ref 30.0–36.0)
MCV: 87.1 fL (ref 80.0–100.0)
Monocytes Absolute: 0.4 10*3/uL (ref 0.1–1.0)
Monocytes Relative: 9 %
Neutro Abs: 2.9 10*3/uL (ref 1.7–7.7)
Neutrophils Relative %: 63 %
Platelets: 221 10*3/uL (ref 150–400)
RBC: 5.58 MIL/uL (ref 4.22–5.81)
RDW: 12.3 % (ref 11.5–15.5)
WBC: 4.6 10*3/uL (ref 4.0–10.5)
nRBC: 0 % (ref 0.0–0.2)

## 2019-06-12 LAB — CMP (CANCER CENTER ONLY)
ALT: 19 U/L (ref 0–44)
AST: 21 U/L (ref 15–41)
Albumin: 4.3 g/dL (ref 3.5–5.0)
Alkaline Phosphatase: 51 U/L (ref 38–126)
Anion gap: 9 (ref 5–15)
BUN: 20 mg/dL (ref 6–20)
CO2: 27 mmol/L (ref 22–32)
Calcium: 9.1 mg/dL (ref 8.9–10.3)
Chloride: 106 mmol/L (ref 98–111)
Creatinine: 1.06 mg/dL (ref 0.61–1.24)
GFR, Est AFR Am: >60 mL/min (ref >60)
GFR, Estimated: >60 mL/min (ref >60)
Glucose, Bld: 65 mg/dL — ABNORMAL LOW (ref 70–99)
Potassium: 4.3 mmol/L (ref 3.5–5.1)
Sodium: 142 mmol/L (ref 135–145)
Total Bilirubin: 0.7 mg/dL (ref 0.3–1.2)
Total Protein: 7.4 g/dL (ref 6.5–8.1)

## 2019-06-12 LAB — LACTATE DEHYDROGENASE: LDH: 165 U/L (ref 98–192)

## 2019-06-12 NOTE — Progress Notes (Signed)
HEMATOLOGY/ONCOLOGY CONSULTATION NOTE  Date of Service: 06/12/2019  Patient Care Team: System, Pcp Not In as PCP - General  CHIEF COMPLAINTS/PURPOSE OF CONSULTATION:  F/u Diffuse Large B-Cell Lymphoma  Oncologic History:  Terry Dillon was diagnosed with Diffuse Large B-Cell Lymphoma, germinal center type, with an excisional biopsy of a right level II deep cervical lymph node on 01/03/18. He was staged as DLBCL, NOS, stage IA, with no BCL2, no BCL6, and no MYC6 rearrangement events detected on FISH. He completed 3 planned cycles of R-CHOP between 02/03/18 and 03/20/18 with Dr. Lavona Mound in [place], [place]. The pt then received consolidative ISRT of 48 Gy over 24 fractions to the right parotid and right neck between 04/28/18 and 05/23/18 with Dr. Zara Council in [place], [place]. His last imaging from 07/25/18 CT Neck did not reveal any remaining signs of lymphoma or suspicious adenopathy.  HISTORY OF PRESENTING ILLNESS:   Terry Dillon is a wonderful 48 y.o. male who has been referred to [place] by Dr. Lavona Mound  for evaluation and management of his Diffuse Large B-Cell Lymphoma. The pt reports that he is doing well overall.   The pt reports that he first noticed a small right neck swelling in May 2019. He notes that he did not feel that this changed much prior to this biopsy in July 2019. He did not have any fevers, chills, night sweats nor unexpected weight loss. The pt notes that he was found to have an infection in his parotid gland, which responded to a week of antibiotics, just prior to his initial biopsy.  The pt notes that he began feeling some soreness in the right side of his neck two weeks ago, which he attributed to working out. He notes that his wife felt that this spot is swollen two days ago, though the pt denies feeling nodularity or swelling. He is not sure if this is scar tissue related to his RT or recurrence.  He denies local trauma, sore throat, cough, runny nose or concern for  infections. He denies fevers, chills, night sweats or unexpected weight loss.   The pt notes that he is sleeping alright and endorses good energy levels. He notes that he is pretty thirsty, and his dry mouth is improving slowly since completing RT. He endorses some pain in his right jaw since completing RT when he yawns or opens his mouth widely. He notes that he does not have other dental pain.  The pt notes that he still has his port, and that this hasn't been flushed or accessed since he completed chemotherapy in October.  Most recent lab results not made available at time of initial visit.  On review of systems, pt reports right neck soreness, good energy levels, staying active, right jaw pain, and denies fevers, chills, night sweats, unexpected weigh loss, dental pain, cough, runny nose, throat soreness, back pains, abdominal pains, inguinal pain, leg swelling, and any other symptoms.  On PMHx the pt reports DLBCL diagnosed in July 2019 and denies other medical concerns. 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 The pt denies consuming ETOH whatsoever.  Interval History  Terry Dillon is a 48 y.o. male presenting today for the management and evaluation of DLBCL. The patient's last visit with [place] was on 02/10/2019. The pt reports that he is doing well overall.  The pt reports he still has swishing in ears but it has not progressed.  Lab results today (06/12/19) of CBC  w/diff and CMP is as follows: all values are WNL except for Glucose Bld at 65.  On review of systems, pt denies fevers, chills, new lumps and bumps, changes in energy, back pain, abdominal pain, leg swelling and any other symptoms.   MEDICAL HISTORY:  No past medical history on file.  SURGICAL HISTORY: Cervical LN biopsy  SOCIAL HISTORY: Social History   Socioeconomic History  . Marital status: Married    Spouse name: Not on file  . Number of children: Not on file  . Years of  education: Not on file  . Highest education level: Not on file  Occupational History  . Not on file  Tobacco Use  . Smoking status: Never Smoker  . Smokeless tobacco: Never Used  Substance and Sexual Activity  . Alcohol use: Not on file  . Drug use: Not on file  . Sexual activity: Not on file  Other Topics Concern  . Not on file  Social History Narrative  . Not on file   Social Determinants of Health   Financial Resource Strain:   . Difficulty of Paying Living Expenses: Not on file  Food Insecurity:   . Worried About Charity fundraiser in the Last Year: Not on file  . Ran Out of Food in the Last Year: Not on file  Transportation Needs:   . Lack of Transportation (Medical): Not on file  . Lack of Transportation (Non-Medical): Not on file  Physical Activity:   . Days of Exercise per Week: Not on file  . Minutes of Exercise per Session: Not on file  Stress:   . Feeling of Stress : Not on file  Social Connections:   . Frequency of Communication with Friends and Family: Not on file  . Frequency of Social Gatherings with Friends and Family: Not on file  . Attends Religious Services: Not on file  . Active Member of Clubs or Organizations: Not on file  . Attends Archivist Meetings: Not on file  . Marital Status: Not on file  Intimate Partner Violence:   . Fear of Current or Ex-Partner: Not on file  . Emotionally Abused: Not on file  . Physically Abused: Not on file  . Sexually Abused: Not on file    FAMILY HISTORY: No family history on file.  ALLERGIES:  has no allergies on file.  MEDICATIONS:  No current outpatient medications on file.   No current facility-administered medications for this visit.    REVIEW OF SYSTEMS:   A 10+ POINT REVIEW OF SYSTEMS WAS OBTAINED including neurology, dermatology, psychiatry, cardiac, respiratory, lymph, extremities, GI, GU, Musculoskeletal, constitutional, breasts, reproductive, HEENT.  All pertinent positives are noted  in the HPI.  All others are negative.    PHYSICAL EXAMINATION: ECOG FS:0 - Asymptomatic  Vitals:   06/12/19 1111  BP: 116/84  Pulse: 65  Resp: 18  Temp: 97.7 F (36.5 C)  SpO2: 100%   Wt Readings from Last 3 Encounters:  06/12/19 244 lb 6.4 oz (110.9 kg)  02/10/19 239 lb 3.2 oz (108.5 kg)  10/22/18 237 lb 14.4 oz (107.9 kg)   Body mass index is 28.9 kg/m.    GENERAL:alert, in no acute distress and comfortable SKIN: no acute rashes, no significant lesions EYES: conjunctiva are pink and non-injected, sclera anicteric OROPHARYNX: MMM, no exudates, no oropharyngeal erythema or ulceration NECK: supple, no JVD LYMPH:  no palpable lymphadenopathy in the cervical, axillary or inguinal regions LUNGS: clear to auscultation b/l with normal respiratory effort  HEART: regular rate & rhythm ABDOMEN:  normoactive bowel sounds , non tender, not distended. Extremity: no pedal edema PSYCH: alert & oriented x 3 with fluent speech NEURO: no focal motor/sensory deficits  LABORATORY DATA:  I have reviewed the data as listed  . CBC Latest Ref Rng & Units 06/12/2019 02/10/2019 12/19/2018  WBC 4.0 - 10.5 K/uL 4.6 5.6 4.4  Hemoglobin 13.0 - 17.0 g/dL 16.5 15.6 14.6  Hematocrit 39.0 - 52.0 % 48.6 46.0 43.3  Platelets 150 - 400 K/uL 221 205 178    . CMP Latest Ref Rng & Units 06/12/2019 02/10/2019 10/22/2018  Glucose 70 - 99 mg/dL 65(L) 90 87  BUN 6 - 20 mg/dL 20 15 14   Creatinine 0.61 - 1.24 mg/dL 1.06 1.40(H) 1.07  Sodium 135 - 145 mmol/L 142 140 143  Potassium 3.5 - 5.1 mmol/L 4.3 4.4 4.2  Chloride 98 - 111 mmol/L 106 103 106  CO2 22 - 32 mmol/L 27 23 27   Calcium 8.9 - 10.3 mg/dL 9.1 9.8 9.4  Total Protein 6.5 - 8.1 g/dL 7.4 7.3 7.4  Total Bilirubin 0.3 - 1.2 mg/dL 0.7 1.1 0.6  Alkaline Phos 38 - 126 U/L 51 49 56  AST 15 - 41 U/L 21 25 19   ALT 0 - 44 U/L 19 20 15    . Lab Results  Component Value Date   LDH 165 06/12/2019     RADIOGRAPHIC STUDIES: I have personally reviewed  the radiological images as listed and agreed with the findings in the report.  07/25/18 CT Neck, s/p ISRT and R-CHOP x3 cycles:   04/18/18 Restaging PET/CT s/p R-CHOP x3 cycles (prior to ISRT):  01/17/18 Initial Staging PET/CT:    No results found.   ASSESSMENT & PLAN:   48 y.o. male with  1. History of Diffuse Large B-Cell Lymphoma, NOS, Stage IA 01/03/18 Excisional biopsy of Right Level II Cervical LN revealed DLBCL, germinal center type FISH negative for BCL2, BCL6, and MYC6 rearrangements S/p 3 cycles of R-CHOP between 02/03/18 and 03/20/18 S/p Consolidative ISRT of 48 Gy over 24 fractions to right parotied and right neck between 04/28/18 and 05/23/18 07/25/18 CT Neck did not reveal any remaining signs of lymphoma nor suspicious adenopathy  PLAN: -Discussed pt labwork today, 06/12/19;  all values are WNL except for Glucose Bld at 65 -Discussed 06/12/19 blood counts are normal -Discussed 06/12/19 Glucose Bld at 65, slightly low -Discussed 06/12/19 LDH at 165, improved -Discussed no concerns for recurrence or progression of  lymphoma at this time -Advised that after two years, the risks of lymphoma reoccurring decreases significantly -Discussed that for the first two years, we have clinical examination and labs every 3-4 months. Advised that it is the pts choice when to schedule clinical examinations. He would like return to rtc every 4 months.  FOLLOW UP: RTC with Dr Irene Limbo with labs in 4 months  The total time spent in the appt was 20 minutes and more than 50% was on counseling and direct patient cares.  All of the patient's questions were answered with apparent satisfaction. The patient knows to call the clinic with any problems, questions or concerns.    Sullivan Lone MD MS AAHIVMS [facility] Hematology/Oncology Physician [facility]  (Office):       [phone] (Work cell):  [phone] (Fax):           [phone]  06/12/2019 6:33 AM  I, Scot Dock, am  acting as a scribe for Dr. Sullivan Lone.   Marland Kitchen  I have reviewed the above documentation for accuracy and completeness, and I agree with the above. Brunetta Genera MD

## 2019-06-15 ENCOUNTER — Telehealth: Payer: Self-pay | Admitting: Hematology

## 2019-06-15 NOTE — Telephone Encounter (Signed)
Scheduled appt per 12/11 los.  Spoke with pt and they are aware of the appt date and time. 

## 2019-10-09 ENCOUNTER — Telehealth: Payer: Self-pay | Admitting: Hematology

## 2019-10-09 NOTE — Telephone Encounter (Signed)
Called pt per 4/9 sch message - unable to reach pt . Left message for pt to calback to reschedule

## 2019-10-12 ENCOUNTER — Ambulatory Visit: Payer: Managed Care, Other (non HMO) | Admitting: Hematology

## 2019-10-12 ENCOUNTER — Other Ambulatory Visit: Payer: Managed Care, Other (non HMO)

## 2020-04-07 ENCOUNTER — Telehealth: Payer: Self-pay | Admitting: Hematology

## 2020-04-07 NOTE — Telephone Encounter (Signed)
Called pt per 10/7 sch msg - left message for patient to call back to schedule appt.
